# Patient Record
Sex: Male | Born: 1990 | Race: White | Hispanic: No | Marital: Married | Smoking: Never smoker
Health system: Southern US, Community
[De-identification: ages and names within clinical notes are randomized; demographics above are authoritative.]

---
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
## 2011-07-29 ENCOUNTER — Emergency Department (HOSPITAL_COMMUNITY): Payer: Self-pay

## 2011-07-29 ENCOUNTER — Emergency Department (HOSPITAL_COMMUNITY)
Admission: EM | Admit: 2011-07-29 | Discharge: 2011-07-29 | Disposition: A | Discharge status: Home / Self Care | Payer: No Typology Code available for payment source | Attending: Emergency Medicine | Admitting: Emergency Medicine

## 2011-07-29 DIAGNOSIS — M542 Cervicalgia: Secondary | ICD-10-CM | POA: Insufficient documentation

## 2012-04-02 ENCOUNTER — Ambulatory Visit (INDEPENDENT_AMBULATORY_CARE_PROVIDER_SITE_OTHER): Payer: 59 | Admitting: Medical

## 2012-04-02 DIAGNOSIS — Z113 Encounter for screening for infections with a predominantly sexual mode of transmission: Secondary | ICD-10-CM

## 2012-04-02 DIAGNOSIS — H9201 Otalgia, right ear: Secondary | ICD-10-CM

## 2012-04-02 DIAGNOSIS — H9209 Otalgia, unspecified ear: Secondary | ICD-10-CM

## 2012-04-02 DIAGNOSIS — Z139 Encounter for screening, unspecified: Secondary | ICD-10-CM

## 2012-04-02 NOTE — Patient Instructions (Signed)

## 2012-04-02 NOTE — Progress Notes (Signed)
  Subjective:   HPI  Seth Lynch is a 21 y.o. male who presents for multiple c/o.  His girlfriend was just diagnosed with shingles.  He notes that he doesn't think he has ever had chicken pox disease or vaccination.  Was to be screened for this given girlfriend shingles infection.  He has questions about her condition, spread of disease and his risks of catching this.    He wants STD screening.  Has had multiple partners prior, monogamous currently.  Doesn't use condoms always.  NO prior hx/o STD.  No symptoms currently.   Lately been having some right ear pain.  Was swimming in the ocean last week.  currently no pain, but wanted this checked out.   No other c/o.  The following portions of the patient's history were reviewed and updated as appropriate: allergies, current medications, past family history, past medical history, past social history, past surgical history and problem list.  Review of Systems ROS reviewed and was negative other than noted in HPI or above.    Objective:   Physical Exam  General appearance: alert, no distress, WD/WN HEENT: normocephalic, sclerae anicteric, TMs pearly, ear canals and pinnae normal, nares patent, no discharge or erythema, pharynx normal Oral cavity: MMM, no lesions Neck: supple, no lymphadenopathy, no thyromegaly, no masses GU: normal male genitalia, no mass or hernia, no rash  Assessment and Plan :     Encounter Diagnoses  Name Primary?  . Screen for STD (sexually transmitted disease) Yes  . Screening for condition   . Otalgia of right ear    STD screening today, discussed safe sex, prevention.  Screen for varicella titer.  If not immune will vaccinate.  Discussed shingles diagnosis, chicken pox disease process, the relation of the 2.    Otalgia - no obvious infection.  Watch and wait approach.  Not currently symptomatic today.

## 2012-04-03 LAB — HIV ANTIBODY (ROUTINE TESTING W REFLEX): HIV: NONREACTIVE

## 2012-04-03 LAB — RPR

## 2012-04-03 LAB — VARICELLA ZOSTER ANTIBODY, IGG: Varicella IgG: 0.86 {ISR}

## 2012-04-03 LAB — GC/CHLAMYDIA PROBE AMP, URINE
Chlamydia, Swab/Urine, PCR: NEGATIVE
GC Probe Amp, Urine: NEGATIVE

## 2012-06-03 ENCOUNTER — Ambulatory Visit (INDEPENDENT_AMBULATORY_CARE_PROVIDER_SITE_OTHER): Payer: 59 | Admitting: Medical

## 2012-06-03 ENCOUNTER — Encounter: Payer: Self-pay | Admitting: Medical

## 2012-06-03 VITALS — BP 112/80 | HR 60 | Temp 98.0°F | Resp 18 | Wt 205.0 lb

## 2012-06-03 DIAGNOSIS — R109 Unspecified abdominal pain: Secondary | ICD-10-CM

## 2012-06-03 DIAGNOSIS — R102 Pelvic and perineal pain: Secondary | ICD-10-CM

## 2012-06-03 DIAGNOSIS — R141 Gas pain: Secondary | ICD-10-CM

## 2012-06-03 DIAGNOSIS — R300 Dysuria: Secondary | ICD-10-CM

## 2012-06-03 DIAGNOSIS — R140 Abdominal distension (gaseous): Secondary | ICD-10-CM

## 2012-06-03 LAB — POCT URINALYSIS DIPSTICK
Bilirubin, UA: NEGATIVE
Blood, UA: NEGATIVE
Glucose, UA: NEGATIVE
Ketones, UA: NEGATIVE
Leukocytes, UA: NEGATIVE
Nitrite, UA: NEGATIVE
Protein, UA: NEGATIVE
Spec Grav, UA: <=1.005
Urobilinogen, UA: NEGATIVE
pH, UA: 7

## 2012-06-03 NOTE — Patient Instructions (Signed)
Start keeping a food and symptom diary to see if you can narrow down potential food triggers.  For the time being, avoid lots of onions, beans, cheese, fried foods, and heavy portions.  Consider beginning Miralax once daily OTC.    Increase water intake.  If you notice any new symptoms - diarrhea, constipation, blood in stool or urine, urinary changed, penile discharge, etc. , then let me know.    If not improving, the next steps are to check some labs and potentially a scan of the abdomen/pelvis.

## 2012-06-03 NOTE — Progress Notes (Signed)
Subjective: Here for c/o abdominal and pelvic pain.   Problem has been occuring for several months, intermittent.  Every few months gets gas pains,bloating and  lower stomach pain/pelvic pain.   He tries to burp or pass gas, but this doesn't help.  Comes on slowly, then is followed by body aches.  Last night he even felt feverish.  Usually will get significant pain for a few hours then is will subside.  Not worse with activity, movement, specific foods.  He denies abdominal trauma.  He does report eating relatively healthy but does eat a good bit of onions.  He denies blood in stool or urine, no urinary symptoms, no penile discharge, no nausea, vomiting, diarrhea or constipation.  No chest pain, SOB.  Nothing seems to help the pain.  No hx/o appendicitis,no family hx/o similar issue.  He has tried ibuprofen which at times can offer some relief briefly.  He did see me months ago for STD screen which was normal.  He has same partner, no new concerns for STD exposure.   No past medical history on file.  Review of Systems As noted above in HPI    Objective:   Physical Exam  Filed Vitals:   06/03/12 1518  BP: 112/80  Pulse: 60  Temp: 98 F (36.7 C)  Resp: 18    General appearance: alert, no distress, WD/WN Oral cavity: MMM, no lesions Neck: supple, no lymphadenopathy, no thyromegaly, no masses Heart: RRR, normal S1, S2, no murmurs Lungs: CTA bilaterally, no wheezes, rhonchi, or rales Abdomen: +bs, soft, mild suprapubic tenderness, otherwise non tender, non distended, no masses, no hepatomegaly, no splenomegaly Pulses: 2+ symmetric GU: normal male genitalia, no lesions, no tenderness, no mass, no hernia  Assessment and Plan :    Encounter Diagnoses  Name Primary?  . Abdominal pain Yes  . Male pelvic pain   . Bloating   . Dysuria    discussed differential which is quite long (gastrointestinal, genitourinary, infection, musculoskeletal, lymphadenitis or possibly even more worrisome  causes such as lymphoma) and his symptoms are vague, more suggestive of bloating/possible IBS.  Discussed keeping food diary and symptom diary.  Avoid onions for now and other foods that cause gas.  He will try the diary for now and begin OTC MIralax daily to help narrow down symptoms and possible triggers.  At any point in the next month or so, I offered that we could get baseline labs and possible abdominal imaging (CT abd/pelvis).  He declines today, but will check insurance copay and deductibles.  Call/reutrn if symptoms persist or worsen.

## 2012-06-04 ENCOUNTER — Encounter: Payer: Self-pay | Admitting: Medical

## 2012-08-01 ENCOUNTER — Encounter (HOSPITAL_COMMUNITY)
Admission: EM | Disposition: A | Discharge status: Home / Self Care | Payer: Self-pay | Source: Home / Self Care | Attending: Emergency Medicine

## 2012-08-01 ENCOUNTER — Ambulatory Visit (HOSPITAL_COMMUNITY)
Admission: EM | Admit: 2012-08-01 | Discharge: 2012-08-02 | DRG: 343 | Disposition: A | Discharge status: Home / Self Care | Payer: Commercial Managed Care - PPO | Attending: Surgery | Admitting: Surgery

## 2012-08-01 ENCOUNTER — Emergency Department (HOSPITAL_COMMUNITY): Payer: Commercial Managed Care - PPO

## 2012-08-01 ENCOUNTER — Encounter (HOSPITAL_COMMUNITY): Payer: Self-pay | Admitting: Certified Registered"

## 2012-08-01 ENCOUNTER — Emergency Department (HOSPITAL_COMMUNITY): Payer: Commercial Managed Care - PPO | Admitting: Certified Registered"

## 2012-08-01 ENCOUNTER — Encounter (HOSPITAL_COMMUNITY): Payer: Self-pay | Admitting: *Deleted

## 2012-08-01 DIAGNOSIS — K37 Unspecified appendicitis: Secondary | ICD-10-CM

## 2012-08-01 DIAGNOSIS — K3580 Unspecified acute appendicitis: Secondary | ICD-10-CM

## 2012-08-01 DIAGNOSIS — F17200 Nicotine dependence, unspecified, uncomplicated: Secondary | ICD-10-CM | POA: Insufficient documentation

## 2012-08-01 DIAGNOSIS — R1031 Right lower quadrant pain: Secondary | ICD-10-CM | POA: Insufficient documentation

## 2012-08-01 HISTORY — PX: LAPAROSCOPIC APPENDECTOMY: SHX408

## 2012-08-01 LAB — COMPREHENSIVE METABOLIC PANEL
ALT: 20 U/L (ref 0–53)
AST: 25 U/L (ref 0–37)
Albumin: 4.7 g/dL (ref 3.5–5.2)
Alkaline Phosphatase: 41 U/L (ref 39–117)
BUN: 19 mg/dL (ref 6–23)
CO2: 21 mEq/L (ref 19–32)
Calcium: 9.5 mg/dL (ref 8.4–10.5)
Chloride: 102 mEq/L (ref 96–112)
Creatinine, Ser: 1.02 mg/dL (ref 0.50–1.35)
GFR calc Af Amer: >90 mL/min (ref >90)
GFR calc non Af Amer: >90 mL/min (ref >90)
Glucose, Bld: 95 mg/dL (ref 70–99)
Potassium: 3.7 mEq/L (ref 3.5–5.1)
Sodium: 136 mEq/L (ref 135–145)
Total Bilirubin: 0.6 mg/dL (ref 0.3–1.2)
Total Protein: 7.3 g/dL (ref 6.0–8.3)

## 2012-08-01 LAB — CBC WITH DIFFERENTIAL/PLATELET
Basophils Absolute: 0 10*3/uL (ref 0.0–0.1)
Basophils Relative: 0 % (ref 0–1)
Eosinophils Absolute: 0.3 10*3/uL (ref 0.0–0.7)
Eosinophils Relative: 2 % (ref 0–5)
HCT: 44.7 % (ref 39.0–52.0)
Hemoglobin: 15.7 g/dL (ref 13.0–17.0)
Lymphocytes Relative: 7 % — ABNORMAL LOW (ref 12–46)
Lymphs Abs: 1.3 10*3/uL (ref 0.7–4.0)
MCH: 30.4 pg (ref 26.0–34.0)
MCHC: 35.1 g/dL (ref 30.0–36.0)
MCV: 86.5 fL (ref 78.0–100.0)
Monocytes Absolute: 1.5 10*3/uL — ABNORMAL HIGH (ref 0.1–1.0)
Monocytes Relative: 9 % (ref 3–12)
Neutro Abs: 14.6 10*3/uL — ABNORMAL HIGH (ref 1.7–7.7)
Neutrophils Relative %: 82 % — ABNORMAL HIGH (ref 43–77)
Platelets: 162 10*3/uL (ref 150–400)
RBC: 5.17 MIL/uL (ref 4.22–5.81)
RDW: 12.6 % (ref 11.5–15.5)
WBC: 17.8 10*3/uL — ABNORMAL HIGH (ref 4.0–10.5)

## 2012-08-01 LAB — URINALYSIS, ROUTINE W REFLEX MICROSCOPIC
Bilirubin Urine: NEGATIVE
Glucose, UA: NEGATIVE mg/dL
Hgb urine dipstick: NEGATIVE
Ketones, ur: NEGATIVE mg/dL
Leukocytes, UA: NEGATIVE
Nitrite: NEGATIVE
Protein, ur: NEGATIVE mg/dL
Specific Gravity, Urine: 1.017 (ref 1.005–1.030)
Urobilinogen, UA: 0.2 mg/dL (ref 0.0–1.0)
pH: 6 (ref 5.0–8.0)

## 2012-08-01 LAB — LIPASE, BLOOD: Lipase: 23 U/L (ref 11–59)

## 2012-08-01 SURGERY — APPENDECTOMY, LAPAROSCOPIC
Anesthesia: General | Site: Abdomen | Wound class: Contaminated

## 2012-08-01 MED ORDER — BUPIVACAINE-EPINEPHRINE 0.25% -1:200000 IJ SOLN
INTRAMUSCULAR | Status: DC | PRN
  Administered 2012-08-01: 6 mL

## 2012-08-01 MED ORDER — DEXAMETHASONE SODIUM PHOSPHATE 4 MG/ML IJ SOLN
INTRAMUSCULAR | Status: DC | PRN
  Administered 2012-08-01: 4 mg via INTRAVENOUS

## 2012-08-01 MED ORDER — MIDAZOLAM HCL 5 MG/5ML IJ SOLN
INTRAMUSCULAR | Status: DC | PRN
  Administered 2012-08-01: 2 mg via INTRAVENOUS

## 2012-08-01 MED ORDER — ONDANSETRON HCL 4 MG/2ML IJ SOLN
4.0000 mg | Freq: Once | INTRAMUSCULAR | Status: AC
  Administered 2012-08-01 (×2): 4 mg via INTRAVENOUS
  Filled 2012-08-01: qty 2

## 2012-08-01 MED ORDER — IOHEXOL 300 MG/ML  SOLN
20.0000 mL | INTRAMUSCULAR | Status: AC
  Administered 2012-08-01 (×2): 20 mL via ORAL

## 2012-08-01 MED ORDER — GLYCOPYRROLATE 0.2 MG/ML IJ SOLN
INTRAMUSCULAR | Status: DC | PRN
  Administered 2012-08-01: .4 mg via INTRAVENOUS

## 2012-08-01 MED ORDER — SUCCINYLCHOLINE CHLORIDE 20 MG/ML IJ SOLN
INTRAMUSCULAR | Status: DC | PRN
  Administered 2012-08-01: 100 mg via INTRAVENOUS

## 2012-08-01 MED ORDER — OXYCODONE HCL 5 MG PO TABS: 5.0000 mg | ORAL_TABLET | Freq: Once | ORAL | Status: DC | PRN

## 2012-08-01 MED ORDER — SODIUM CHLORIDE 0.9 % IR SOLN
Status: DC | PRN
  Administered 2012-08-01: 1000 mL

## 2012-08-01 MED ORDER — HYDROMORPHONE HCL PF 1 MG/ML IJ SOLN
1.0000 mg | INTRAMUSCULAR | Status: DC | PRN
  Administered 2012-08-01: 2 mg via INTRAVENOUS
  Administered 2012-08-01: 1 mg via INTRAVENOUS
  Administered 2012-08-01: 2 mg via INTRAVENOUS
  Administered 2012-08-02: 1 mg via INTRAVENOUS
  Filled 2012-08-01: qty 1
  Filled 2012-08-01 (×2): qty 2

## 2012-08-01 MED ORDER — NEOSTIGMINE METHYLSULFATE 1 MG/ML IJ SOLN
INTRAMUSCULAR | Status: DC | PRN
  Administered 2012-08-01: 3 mg via INTRAVENOUS

## 2012-08-01 MED ORDER — HYDROMORPHONE HCL PF 1 MG/ML IJ SOLN
0.2500 mg | INTRAMUSCULAR | Status: DC | PRN
  Administered 2012-08-01 (×6): 0.5 mg via INTRAVENOUS

## 2012-08-01 MED ORDER — HYDROMORPHONE HCL PF 1 MG/ML IJ SOLN
INTRAMUSCULAR | Status: AC
  Filled 2012-08-01: qty 1

## 2012-08-01 MED ORDER — MIDAZOLAM HCL 2 MG/2ML IJ SOLN: 1.0000 mg | INTRAMUSCULAR | Status: DC | PRN

## 2012-08-01 MED ORDER — OXYCODONE-ACETAMINOPHEN 5-325 MG PO TABS
1.0000 | ORAL_TABLET | ORAL | Status: DC | PRN
  Administered 2012-08-01 – 2012-08-02 (×3): 2 via ORAL
  Filled 2012-08-01 (×2): qty 2

## 2012-08-01 MED ORDER — FENTANYL CITRATE 0.05 MG/ML IJ SOLN: 50.0000 ug | Freq: Once | INTRAMUSCULAR | Status: DC

## 2012-08-01 MED ORDER — OXYCODONE-ACETAMINOPHEN 5-325 MG PO TABS
ORAL_TABLET | ORAL | Status: AC
  Filled 2012-08-01: qty 2

## 2012-08-01 MED ORDER — ONDANSETRON HCL 4 MG PO TABS: 4.0000 mg | ORAL_TABLET | Freq: Four times a day (QID) | ORAL | Status: DC | PRN

## 2012-08-01 MED ORDER — ONDANSETRON HCL 4 MG/2ML IJ SOLN: 4.0000 mg | Freq: Four times a day (QID) | INTRAMUSCULAR | Status: DC | PRN

## 2012-08-01 MED ORDER — LIDOCAINE HCL (CARDIAC) 20 MG/ML IV SOLN
INTRAVENOUS | Status: DC | PRN
  Administered 2012-08-01: 100 mg via INTRAVENOUS

## 2012-08-01 MED ORDER — KCL IN DEXTROSE-NACL 20-5-0.45 MEQ/L-%-% IV SOLN
INTRAVENOUS | Status: DC
  Administered 2012-08-01 – 2012-08-02 (×2): via INTRAVENOUS
  Filled 2012-08-01 (×5): qty 1000

## 2012-08-01 MED ORDER — 0.9 % SODIUM CHLORIDE (POUR BTL) OPTIME
TOPICAL | Status: DC | PRN
  Administered 2012-08-01: 1000 mL

## 2012-08-01 MED ORDER — ENOXAPARIN SODIUM 40 MG/0.4ML ~~LOC~~ SOLN
40.0000 mg | SUBCUTANEOUS | Status: DC
  Filled 2012-08-01: qty 0.4

## 2012-08-01 MED ORDER — FENTANYL CITRATE 0.05 MG/ML IJ SOLN
INTRAMUSCULAR | Status: DC | PRN
  Administered 2012-08-01: 150 ug via INTRAVENOUS

## 2012-08-01 MED ORDER — IOHEXOL 300 MG/ML  SOLN
100.0000 mL | Freq: Once | INTRAMUSCULAR | Status: AC | PRN
  Administered 2012-08-01: 100 mL via INTRAVENOUS

## 2012-08-01 MED ORDER — SODIUM CHLORIDE 0.9 % IV BOLUS (SEPSIS)
1000.0000 mL | Freq: Once | INTRAVENOUS | Status: AC
  Administered 2012-08-01: 1000 mL via INTRAVENOUS

## 2012-08-01 MED ORDER — HYDROMORPHONE HCL PF 1 MG/ML IJ SOLN: 1.0000 mg | INTRAMUSCULAR | Status: DC | PRN

## 2012-08-01 MED ORDER — SODIUM CHLORIDE 0.9 % IV SOLN
3.0000 g | Freq: Once | INTRAVENOUS | Status: AC
  Administered 2012-08-01: 3 g via INTRAVENOUS
  Filled 2012-08-01: qty 3

## 2012-08-01 MED ORDER — SODIUM CHLORIDE 0.9 % IV SOLN
INTRAVENOUS | Status: DC
  Administered 2012-08-01 (×2): via INTRAVENOUS

## 2012-08-01 MED ORDER — OXYCODONE HCL 5 MG/5ML PO SOLN: 5.0000 mg | Freq: Once | ORAL | Status: DC | PRN

## 2012-08-01 MED ORDER — INFLUENZA VIRUS VACC SPLIT PF IM SUSP
0.5000 mL | INTRAMUSCULAR | Status: DC
  Filled 2012-08-01: qty 0.5

## 2012-08-01 MED ORDER — ROCURONIUM BROMIDE 100 MG/10ML IV SOLN
INTRAVENOUS | Status: DC | PRN
  Administered 2012-08-01: 30 mg via INTRAVENOUS

## 2012-08-01 MED ORDER — PROMETHAZINE HCL 25 MG/ML IJ SOLN: 6.2500 mg | INTRAMUSCULAR | Status: DC | PRN

## 2012-08-01 SURGICAL SUPPLY — 46 items
ADH SKN CLS APL DERMABOND .7 (GAUZE/BANDAGES/DRESSINGS) ×1
APL SKNCLS STERI-STRIP NONHPOA (GAUZE/BANDAGES/DRESSINGS) ×1
APPLIER CLIP ROT 10 11.4 M/L (STAPLE)
APR CLP MED LRG 11.4X10 (STAPLE)
BAG SPEC RTRVL LRG 6X4 10 (ENDOMECHANICALS) ×1
BENZOIN TINCTURE PRP APPL 2/3 (GAUZE/BANDAGES/DRESSINGS) ×1 IMPLANT
BLADE SURG ROTATE 9660 (MISCELLANEOUS) ×1 IMPLANT
CANISTER SUCTION 2500CC (MISCELLANEOUS) ×2 IMPLANT
CHLORAPREP W/TINT 26ML (MISCELLANEOUS) ×2 IMPLANT
CLIP APPLIE ROT 10 11.4 M/L (STAPLE) IMPLANT
CLOTH BEACON ORANGE TIMEOUT ST (SAFETY) ×2 IMPLANT
COVER SURGICAL LIGHT HANDLE (MISCELLANEOUS) ×2 IMPLANT
CUTTER LINEAR ENDO 35 ETS (STAPLE) ×1 IMPLANT
CUTTER LINEAR ENDO 35 ETS TH (STAPLE) IMPLANT
DECANTER SPIKE VIAL GLASS SM (MISCELLANEOUS) ×2 IMPLANT
DERMABOND ADVANCED (GAUZE/BANDAGES/DRESSINGS) ×1
DERMABOND ADVANCED .7 DNX12 (GAUZE/BANDAGES/DRESSINGS) ×1 IMPLANT
DRAPE UTILITY 15X26 W/TAPE STR (DRAPE) ×4 IMPLANT
ELECT REM PT RETURN 9FT ADLT (ELECTROSURGICAL) ×2
ELECTRODE REM PT RTRN 9FT ADLT (ELECTROSURGICAL) ×1 IMPLANT
ENDOLOOP SUT PDS II  0 18 (SUTURE)
ENDOLOOP SUT PDS II 0 18 (SUTURE) IMPLANT
GLOVE BIOGEL PI IND STRL 8 (GLOVE) ×1 IMPLANT
GLOVE BIOGEL PI INDICATOR 8 (GLOVE) ×1
GLOVE ECLIPSE 7.5 STRL STRAW (GLOVE) ×2 IMPLANT
GOWN STRL NON-REIN LRG LVL3 (GOWN DISPOSABLE) ×4 IMPLANT
KIT BASIN OR (CUSTOM PROCEDURE TRAY) ×2 IMPLANT
KIT ROOM TURNOVER OR (KITS) ×2 IMPLANT
NS IRRIG 1000ML POUR BTL (IV SOLUTION) ×2 IMPLANT
PAD ARMBOARD 7.5X6 YLW CONV (MISCELLANEOUS) ×4 IMPLANT
PENCIL BUTTON HOLSTER BLD 10FT (ELECTRODE) IMPLANT
POUCH SPECIMEN RETRIEVAL 10MM (ENDOMECHANICALS) ×2 IMPLANT
RELOAD /EVU35 (ENDOMECHANICALS) ×1 IMPLANT
RELOAD CUTTER ETS 35MM STAND (ENDOMECHANICALS) IMPLANT
SET IRRIG TUBING LAPAROSCOPIC (IRRIGATION / IRRIGATOR) ×2 IMPLANT
SPECIMEN JAR SMALL (MISCELLANEOUS) ×2 IMPLANT
STRIP CLOSURE SKIN 1/2X4 (GAUZE/BANDAGES/DRESSINGS) ×1 IMPLANT
SUT MNCRL AB 4-0 PS2 18 (SUTURE) ×2 IMPLANT
TOWEL OR 17X24 6PK STRL BLUE (TOWEL DISPOSABLE) ×2 IMPLANT
TOWEL OR 17X26 10 PK STRL BLUE (TOWEL DISPOSABLE) ×2 IMPLANT
TRAY FOLEY CATH 14FR (SET/KITS/TRAYS/PACK) ×2 IMPLANT
TRAY LAPAROSCOPIC (CUSTOM PROCEDURE TRAY) ×2 IMPLANT
TROCAR XCEL 12X100 BLDLESS (ENDOMECHANICALS) ×2 IMPLANT
TROCAR XCEL BLUNT TIP 100MML (ENDOMECHANICALS) ×2 IMPLANT
TROCAR XCEL NON-BLD 5MMX100MML (ENDOMECHANICALS) ×2 IMPLANT
WATER STERILE IRR 1000ML POUR (IV SOLUTION) IMPLANT

## 2012-08-01 NOTE — Anesthesia Postprocedure Evaluation (Signed)
  Anesthesia Post-op Note  Patient: Seth Lynch  Procedure(s) Performed: Procedure(s) (LRB) with comments: APPENDECTOMY LAPAROSCOPIC (N/A)  Patient Location: PACU  Anesthesia Type:General  Level of Consciousness: awake  Airway and Oxygen Therapy: Patient Spontanous Breathing  Post-op Pain: mild  Post-op Assessment: Post-op Vital signs reviewed  Post-op Vital Signs: Reviewed  Complications: No apparent anesthesia complications

## 2012-08-01 NOTE — H&P (Signed)
Seth Lynch is an 21 y.o. male.   Chief Complaint: Abdominal pain and acute appendicitis HPI: Patient has had abdominal pain in a recurrent pattern over the past year.  Saw his PCP about a month ago and this was thought to be gastroenteritis, but the pain is essentially the same.  Worse this time but in the same area.  CT demonstrates acute appendicitis with WBC of 17K.  History reviewed. No pertinent past medical history.  History reviewed. No pertinent past surgical history.  No family history on file. Social History:  reports that he has been smoking Cigarettes.  He does not have any smokeless tobacco history on file. He reports that he drinks about 9 ounces of alcohol per week. He reports that he uses illicit drugs (Marijuana) about 3 times per week.  Allergies: No Known Allergies   (Not in a hospital admission)  Results for orders placed during the hospital encounter of 08/01/12 (from the past 48 hour(s))  CBC WITH DIFFERENTIAL     Status: Abnormal   Collection Time   08/01/12 12:56 AM      Component Value Range Comment   WBC 17.8 (*) 4.0 - 10.5 K/uL    RBC 5.17  4.22 - 5.81 MIL/uL    Hemoglobin 15.7  13.0 - 17.0 g/dL    HCT 14.7  82.9 - 56.2 %    MCV 86.5  78.0 - 100.0 fL    MCH 30.4  26.0 - 34.0 pg    MCHC 35.1  30.0 - 36.0 g/dL    RDW 13.0  86.5 - 78.4 %    Platelets 162  150 - 400 K/uL    Neutrophils Relative 82 (*) 43 - 77 %    Neutro Abs 14.6 (*) 1.7 - 7.7 K/uL    Lymphocytes Relative 7 (*) 12 - 46 %    Lymphs Abs 1.3  0.7 - 4.0 K/uL    Monocytes Relative 9  3 - 12 %    Monocytes Absolute 1.5 (*) 0.1 - 1.0 K/uL    Eosinophils Relative 2  0 - 5 %    Eosinophils Absolute 0.3  0.0 - 0.7 K/uL    Basophils Relative 0  0 - 1 %    Basophils Absolute 0.0  0.0 - 0.1 K/uL   COMPREHENSIVE METABOLIC PANEL     Status: Normal   Collection Time   08/01/12 12:56 AM      Component Value Range Comment   Sodium 136  135 - 145 mEq/L    Potassium 3.7  3.5 - 5.1 mEq/L    Chloride 102  96 - 112 mEq/L    CO2 21  19 - 32 mEq/L    Glucose, Bld 95  70 - 99 mg/dL    BUN 19  6 - 23 mg/dL    Creatinine, Ser 6.96  0.50 - 1.35 mg/dL    Calcium 9.5  8.4 - 29.5 mg/dL    Total Protein 7.3  6.0 - 8.3 g/dL    Albumin 4.7  3.5 - 5.2 g/dL    AST 25  0 - 37 U/L    ALT 20  0 - 53 U/L    Alkaline Phosphatase 41  39 - 117 U/L    Total Bilirubin 0.6  0.3 - 1.2 mg/dL    GFR calc non Af Amer >90  >90 mL/min    GFR calc Af Amer >90  >90 mL/min   LIPASE, BLOOD     Status: Normal  Collection Time   08/01/12 12:56 AM      Component Value Range Comment   Lipase 23  11 - 59 U/L   URINALYSIS, ROUTINE W REFLEX MICROSCOPIC     Status: Normal   Collection Time   08/01/12  1:11 AM      Component Value Range Comment   Color, Urine YELLOW  YELLOW    APPearance CLEAR  CLEAR    Specific Gravity, Urine 1.017  1.005 - 1.030    pH 6.0  5.0 - 8.0    Glucose, UA NEGATIVE  NEGATIVE mg/dL    Hgb urine dipstick NEGATIVE  NEGATIVE    Bilirubin Urine NEGATIVE  NEGATIVE    Ketones, ur NEGATIVE  NEGATIVE mg/dL    Protein, ur NEGATIVE  NEGATIVE mg/dL    Urobilinogen, UA 0.2  0.0 - 1.0 mg/dL    Nitrite NEGATIVE  NEGATIVE    Leukocytes, UA NEGATIVE  NEGATIVE MICROSCOPIC NOT DONE ON URINES WITH NEGATIVE PROTEIN, BLOOD, LEUKOCYTES, NITRITE, OR GLUCOSE <1000 mg/dL.   Ct Abdomen Pelvis W Contrast  08/01/2012  *RADIOLOGY REPORT*  Clinical Data: Right lower quadrant abdominal pain.  CT ABDOMEN AND PELVIS WITH CONTRAST  Technique:  Multidetector CT imaging of the abdomen and pelvis was performed following the standard protocol during bolus administration of intravenous contrast.  Contrast: OMNIPAQUE IOHEXOL 300 MG/ML  SOLN  Comparison: None.  Findings: The visualized lung bases are clear.  The liver and spleen are unremarkable in appearance.  The gallbladder is within normal limits.  The pancreas and adrenal glands are unremarkable.  The kidneys are unremarkable in appearance.  There is no evidence of  hydronephrosis.  No renal or ureteral stones are seen.  No perinephric stranding is appreciated.  No free fluid is identified.  The small bowel is unremarkable in appearance.  The stomach is within normal limits.  No acute vascular abnormalities are seen.  The appendix is dilated to 1.1 cm in maximal diameter, with associated wall enhancement and thickening, and mild associated soft tissue stranding.  This is compatible with mild acute appendicitis.  The appendix is predominately inferior to the cecum.  No significant pericecal lymphadenopathy is seen.  There is no evidence of perforation or abscess formation.  The colon is unremarkable in appearance.  The bladder is mildly distended and grossly unremarkable in appearance.  The prostate is normal in size.  No inguinal lymphadenopathy is seen.  No acute osseous abnormalities are identified.  IMPRESSION: Mild acute appendicitis noted, with dilatation of the appendix to 1.1 cm in maximal diameter, and associated wall enhancement and thickening.  Mild soft tissue stranding noted.  No evidence for perforation or abscess formation.   Original Report Authenticated By: Tonia Ghent, M.D.     Review of Systems  Constitutional: Negative for fever, chills and weight loss.  HENT: Negative.   Eyes: Negative.   Respiratory: Negative.   Cardiovascular: Negative.   Gastrointestinal: Positive for abdominal pain.  Genitourinary: Negative.   Musculoskeletal: Negative.   Skin: Negative.   Neurological: Negative.   Endo/Heme/Allergies: Negative.   Psychiatric/Behavioral: Negative.     Blood pressure 114/61, pulse 66, temperature 97.9 F (36.6 C), temperature source Oral, resp. rate 18, height 6' (1.829 m), weight 97.523 kg (215 lb), SpO2 98.00%. Physical Exam  Constitutional: He is oriented to person, place, and time. He appears well-developed and well-nourished.  HENT:  Head: Normocephalic and atraumatic.  Eyes: Conjunctivae normal and EOM are normal. Pupils  are equal, round, and reactive to  light.  Neck: Normal range of motion. Neck supple.  Cardiovascular: Normal rate, regular rhythm and normal heart sounds.   Respiratory: Effort normal and breath sounds normal.  GI: Soft. Bowel sounds are decreased. There is tenderness (positive Rovsing sign) in the right lower quadrant and suprapubic area. There is tenderness at McBurney's point. There is no rebound and no CVA tenderness.  Genitourinary: Penis normal.  Musculoskeletal: Normal range of motion.  Neurological: He is alert and oriented to person, place, and time. He has normal reflexes.  Skin: Skin is warm and dry.  Psychiatric: He has a normal mood and affect. His behavior is normal. Judgment and thought content normal.     Assessment/Plan This recurrent pattern of abdominal pain is inconsistent with acute appendicitis, but CT findings and elevated WBC strongly suggest that diagnosis.   Will take the patient to the OR for diagnostic laparoscopy with appendectomy.  Cherylynn Ridges 08/01/2012, 5:50 AM

## 2012-08-01 NOTE — ED Notes (Signed)
Patient transported to CT 

## 2012-08-01 NOTE — ED Notes (Signed)
Unasyn will be sent with pt on call to OR

## 2012-08-01 NOTE — Progress Notes (Signed)
Report from Devota Pace, RN (PACU)

## 2012-08-01 NOTE — ED Provider Notes (Signed)
History     CSN: [id number]  Arrival date & time 08/01/12  0045   First MD Initiated Contact with Patient 08/01/12 0149      Chief Complaint  Patient presents with  . Abdominal Pain    (Consider location/radiation/quality/duration/timing/severity/associated sxs/prior treatment) HPI.... right lower quadrant pain intermittently for one year. Symptoms returned in the past 24 hours. Poor appetite. No radiation of pain. No fever, chills, dysuria, chest pain, shortness of breath. Severity is mild to moderate.  Patient is normally healthy.  History reviewed. No pertinent past medical history.  History reviewed. No pertinent past surgical history.  No family history on file.  History  Substance Use Topics  . Smoking status: Current Some Day Smoker    Types: Cigarettes  . Smokeless tobacco: Not on file  . Alcohol Use: 9.0 oz/week    15 Cans of beer per week      Review of Systems  All other systems reviewed and are negative.    Allergies  Review of patient's allergies indicates no known allergies.  Home Medications   Current Outpatient Rx  Name  Route  Sig  Dispense  Refill  . IBUPROFEN 200 MG PO TABS   Oral   Take 200-1,000 mg by mouth every 6 (six) hours as needed. pain           BP 114/61  Pulse 66  Temp 97.9 F (36.6 C) (Oral)  Resp 18  Ht 6' (1.829 m)  Wt 215 lb (97.523 kg)  BMI 29.16 kg/m2  SpO2 98%  Physical Exam  Nursing note and vitals reviewed. Constitutional: He is oriented to person, place, and time. He appears well-developed and well-nourished.  HENT:  Head: Normocephalic and atraumatic.  Eyes: Conjunctivae normal and EOM are normal. Pupils are equal, round, and reactive to light.  Neck: Normal range of motion. Neck supple.  Cardiovascular: Normal rate, regular rhythm and normal heart sounds.   Pulmonary/Chest: Effort normal and breath sounds normal.  Abdominal: Soft. Bowel sounds are normal.       Tender right lower quadrant over  McBurney's point  Musculoskeletal: Normal range of motion.  Neurological: He is alert and oriented to person, place, and time.  Skin: Skin is warm and dry.  Psychiatric: He has a normal mood and affect.    ED Course  Procedures (including critical care time)  Labs Reviewed  CBC WITH DIFFERENTIAL - Abnormal; Notable for the following:    WBC 17.8 (*)     Neutrophils Relative 82 (*)     Neutro Abs 14.6 (*)     Lymphocytes Relative 7 (*)     Monocytes Absolute 1.5 (*)     All other components within normal limits  COMPREHENSIVE METABOLIC PANEL  LIPASE, BLOOD  URINALYSIS, ROUTINE W REFLEX MICROSCOPIC   Ct Abdomen Pelvis W Contrast  08/01/2012  *RADIOLOGY REPORT*  Clinical Data: Right lower quadrant abdominal pain.  CT ABDOMEN AND PELVIS WITH CONTRAST  Technique:  Multidetector CT imaging of the abdomen and pelvis was performed following the standard protocol during bolus administration of intravenous contrast.  Contrast: OMNIPAQUE IOHEXOL 300 MG/ML  SOLN  Comparison: None.  Findings: The visualized lung bases are clear.  The liver and spleen are unremarkable in appearance.  The gallbladder is within normal limits.  The pancreas and adrenal glands are unremarkable.  The kidneys are unremarkable in appearance.  There is no evidence of hydronephrosis.  No renal or ureteral stones are seen.  No perinephric stranding is appreciated.  No free fluid is identified.  The small bowel is unremarkable in appearance.  The stomach is within normal limits.  No acute vascular abnormalities are seen.  The appendix is dilated to 1.1 cm in maximal diameter, with associated wall enhancement and thickening, and mild associated soft tissue stranding.  This is compatible with mild acute appendicitis.  The appendix is predominately inferior to the cecum.  No significant pericecal lymphadenopathy is seen.  There is no evidence of perforation or abscess formation.  The colon is unremarkable in appearance.  The  bladder is mildly distended and grossly unremarkable in appearance.  The prostate is normal in size.  No inguinal lymphadenopathy is seen.  No acute osseous abnormalities are identified.  IMPRESSION: Mild acute appendicitis noted, with dilatation of the appendix to 1.1 cm in maximal diameter, and associated wall enhancement and thickening.  Mild soft tissue stranding noted.  No evidence for perforation or abscess formation.   Original Report Authenticated By: Tonia Ghent, M.D.      No diagnosis found.    MDM  Patient is nontoxic. However, he is tender around McBurney's point. White count 17,000.  CT suggestive of mild acute appendicitis.  Consult general surgery        Donnetta Hutching, MD 08/01/12 [phone]

## 2012-08-01 NOTE — ED Notes (Signed)
Pt returned to room  

## 2012-08-01 NOTE — Transfer of Care (Signed)
Immediate Anesthesia Transfer of Care Note  Patient: Seth Lynch  Procedure(s) Performed: Procedure(s) (LRB) with comments: APPENDECTOMY LAPAROSCOPIC (N/A)  Patient Location: PACU  Anesthesia Type:General  Level of Consciousness: awake, alert  and oriented  Airway & Oxygen Therapy: Patient Spontanous Breathing  Post-op Assessment: Report given to PACU RN, Post -op Vital signs reviewed and stable and Patient moving all extremities X 4  Post vital signs: Reviewed and stable  Complications: No apparent anesthesia complications

## 2012-08-01 NOTE — ED Notes (Signed)
Pt to ED c/o RLQ abd pain.  Pt has been experiencing this pain for at least a year and has been seen by pcp with no dx.  Pt states pain usually comes on once a month, last for one day, is associated with emesis and is mildly alleviated by ibuprofen.  Denies changes in bowel and bladder habits before, after or during episodes.  Pt took 1000 mg of ibuprofen at 2030 and 600 mg of ibuprofen at 12 am with minimal relief.

## 2012-08-01 NOTE — Progress Notes (Signed)
Pt is on hold for med/surg bed assignment. Family is aware.  Mom and friend in PACU for a brief visit. They are going home...will call them when patient is assigned to a room.

## 2012-08-01 NOTE — Anesthesia Preprocedure Evaluation (Addendum)
Anesthesia Evaluation  Patient identified by MRN, date of birth, ID band Patient awake    Reviewed: Allergy & Precautions, H&P , NPO status , Patient's Chart, lab work & pertinent test results, reviewed documented beta blocker date and time   Airway Mallampati: I TM Distance: >3 FB Neck ROM: full    Dental  (+) Teeth Intact   Pulmonary neg pulmonary ROS, Current Smoker,  breath sounds clear to auscultation        Cardiovascular Exercise Tolerance: Good negative cardio ROS  Rhythm:Regular Rate:Normal     Neuro/Psych negative neurological ROS     GI/Hepatic negative GI ROS, Neg liver ROS, (+)     substance abuse  marijuana use, Acuta apendicitis   Endo/Other  negative endocrine ROS  Renal/GU negative Renal ROS  negative genitourinary   Musculoskeletal   Abdominal (+)  Abdomen: tender.    Peds  Hematology negative hematology ROS (+)   Anesthesia Other Findings   Reproductive/Obstetrics                          Anesthesia Physical Anesthesia Plan  ASA: II and emergent  Anesthesia Plan: General ETT and General   Post-op Pain Management:    Induction: Intravenous, Rapid sequence and Cricoid pressure planned  Airway Management Planned: Oral ETT  Additional Equipment:   Intra-op Plan:   Post-operative Plan: Extubation in OR  Informed Consent: I have reviewed the patients History and Physical, chart, labs and discussed the procedure including the risks, benefits and alternatives for the proposed anesthesia with the patient or authorized representative who has indicated his/her understanding and acceptance.   Dental Advisory Given  Plan Discussed with: CRNA and Surgeon  Anesthesia Plan Comments:        Anesthesia Quick Evaluation

## 2012-08-01 NOTE — Op Note (Signed)
OPERATIVE REPORT  DATE OF OPERATION: 08/01/2012  PATIENT:  Seth Lynch  21 y.o. male  PRE-OPERATIVE DIAGNOSIS:  Appendicitis, acute [540.9]  POST-OPERATIVE DIAGNOSIS:  Appendicitis, acute [540.9]  PROCEDURE:  Procedure(s): APPENDECTOMY LAPAROSCOPIC  SURGEON:  Surgeon(s): Cherylynn Ridges, MD  ASSISTANT: None  ANESTHESIA:   general  EBL: <20 ml  BLOOD ADMINISTERED: none  DRAINS: none   SPECIMEN:  Source of Specimen:  Appendix  COUNTS CORRECT:  YES  PROCEDURE DETAILS: The patient was taken to the operating room and placed on the table in supine position. After an adequate general endotracheal anesthetic was administered he was prepped and draped in usual sterile manner exposing his entire abdomen.  After proper time out was performed identifying the patient and procedure to be performed a supraumbilical midline incision was made using #15 blade and taken down to the midline fascia. The fascia was nicked with a 15 blade then subsequently the edges were grabbed with Kocher clamps. As we tented up with the Kocher clamps we bluntly dissected down into the peritoneal cavity using a Kelly clamp. Once this was done a pursestring suture of 0 Vicryl was passed around the fascial opening. We then inserted a Hassan cannula into the peritoneal cavity through which carbon dioxide gas was insufflated up to maximal intra-abdominal pressure of 15 mm mercury.  Right upper quadrant 5 mm cannula and the left lower quadrant 12 mm cannula passed under direct vision. Once all cannulas were in place the patient was placed in Trendelenburg in the left side was tilted down.  The acutely inflamed appendix was easily noted in the right lower quadrant. We dissected the base of the appendix from the base of the cecum and came across the base with a Endo GIA 3.5 mm blue cartridge. We then dissected out the mesoappendix and came across that with a 2.5 mm a white Endo GIA cartridge. Once this was done those  minimal bleeding and we retrieved the appendix from the left lower quadrant cannula site with an Endo Catch bag.  Once this was completed we inspected the right lower quadrant site with those minimal bleeding. We irrigated with a small amount of saline. We aspirated all fluid and gas from above the liver the removed all cannulas.  The supraumbilical fascial site was closed using a Pershing suture which was in place. Once that was done we injected all sites were quarter percent Marcaine with epinephrine. All counts were correct. The skin was closed at the left lower quadrant and the subumbilical site using running subcuticular stitch of 4-0 Monocryl. Dermabond surges and Tegaderms use complete all dressings.  PATIENT DISPOSITION:  PACU - hemodynamically stable.   Sharayah Renfrow O 11/7/20137:59 AM

## 2012-08-02 ENCOUNTER — Encounter (HOSPITAL_COMMUNITY): Payer: Self-pay | Admitting: General Surgery

## 2012-08-02 MED ORDER — OXYCODONE-ACETAMINOPHEN 5-325 MG PO TABS: 1.0000 | ORAL_TABLET | ORAL | Status: DC | PRN

## 2012-08-02 NOTE — Discharge Summary (Signed)
Improving well.  Follow up in clinic in a few weeks.  If not improving or worse, call us sooner

## 2012-08-02 NOTE — Progress Notes (Signed)
DC HOME WITH GIRLFRIEND. VERBALLY UNDERSTOOD DC INSTRUCTIONS. NO QUESTIONS ASK.

## 2012-08-02 NOTE — Discharge Summary (Signed)
Patient ID: BAER SNAY MRN: [id number] DOB/AGE: 11/16/1990 21 y.o.  Admit date: 08/01/2012 Discharge date: 08/02/2012  Procedures: laparoscopic appendectomy  Consults: None  Reason for Admission: Patient has had abdominal pain in a recurrent pattern over the past year. Saw his PCP about a month ago and this was thought to be gastroenteritis, but the pain is essentially the same. Worse this time but in the same area. CT demonstrates acute appendicitis with WBC of 17K.  Admission Diagnoses:  1. Acute appendicitis  Hospital Course: The patient was admitted and taken to the operating room where he underwent a lap appy.  The patient tolerated the procedure well.  On POD# 1, his pain was well controlled and he was tolerating a regular diet.  He was felt stable for dc home.  PE: Abd: soft, appropriately tender, +BS, incisions c/d/i with some slight serous drainage noted  Discharge Diagnoses:  Active Problems:  Acute appendicitis s/p lap appy  Discharge Medications:   Medication List     As of 08/02/2012  8:55 AM    TAKE these medications         ibuprofen 200 MG tablet   Commonly known as: ADVIL,MOTRIN   Take 200-1,000 mg by mouth every 6 (six) hours as needed. pain      oxyCODONE-acetaminophen 5-325 MG per tablet   Commonly known as: PERCOCET/ROXICET   Take 1-2 tablets by mouth every 4 (four) hours as needed.        Discharge Instructions:     Follow-up Information    Follow up with [facility]. On 08/20/2012. (3:00pm. arrive at 2:30pm)    Contact information:   [address]   [place] [address] [phone]          Signed: Letha Cape 08/02/2012, 8:55 AM

## 2012-08-14 ENCOUNTER — Telehealth: Payer: Self-pay | Admitting: Family Medicine

## 2012-08-14 NOTE — Telephone Encounter (Signed)
Patient called for refill on Percocet, after reviewing his chart , we have not seen the patient recently or prescribed percocet. Per Dr. Susann Givens advised him that we can not refill Rx. Patient states that he had appendectomy done couple of weeks ago and was given that at the hospital. He states that he is still having pain, advised him to contact the surgeon

## 2012-08-14 NOTE — Telephone Encounter (Signed)
Wants refill on generic percocet    [facility] [address] [employer]

## 2012-08-20 ENCOUNTER — Encounter (INDEPENDENT_AMBULATORY_CARE_PROVIDER_SITE_OTHER): Payer: 59

## 2012-09-12 ENCOUNTER — Ambulatory Visit (INDEPENDENT_AMBULATORY_CARE_PROVIDER_SITE_OTHER): Payer: Commercial Managed Care - PPO | Admitting: Family Medicine

## 2012-09-12 ENCOUNTER — Encounter: Payer: Self-pay | Admitting: Family Medicine

## 2012-09-12 VITALS — BP 118/80 | HR 88 | Temp 98.7°F | Wt 216.0 lb

## 2012-09-12 DIAGNOSIS — B029 Zoster without complications: Secondary | ICD-10-CM

## 2012-09-12 MED ORDER — HYDROCODONE-ACETAMINOPHEN 7.5-750 MG PO TABS: 1.0000 | ORAL_TABLET | ORAL | Status: DC | PRN

## 2012-09-12 MED ORDER — VALACYCLOVIR HCL 1 G PO TABS: 1000.0000 mg | ORAL_TABLET | Freq: Three times a day (TID) | ORAL | Status: AC

## 2012-09-12 NOTE — Patient Instructions (Addendum)
Shingles Shingles is caused by the same virus that causes chickenpox (varicella zoster virus or VZV). Shingles often occurs many years or decades after having chickenpox. That is why it is more common in adults older than 50 years. The virus reactivates and breaks out as an infection in a nerve root. SYMPTOMS   The initial feeling (sensations) may be pain. This pain is usually described as:  Burning.  Stabbing.  Throbbing.  Tingling in the nerve root.  A red rash will follow in a couple days. The rash may occur in any area of the body and is usually on one side (unilateral) of the body in a band or belt-like pattern. The rash usually starts out as very small blisters (vesicles). They will dry up after 7 to 10 days. This is not usually a significant problem except for the pain it causes.  Long-lasting (chronic) pain is more likely in an elderly person. It can last months to years. This condition is called postherpetic neuralgia. Shingles can be an extremely severe infection in someone with AIDS, a weakened immune system, or with forms of leukemia. It can also be severe if you are taking transplant medicines or other medicines that weaken the immune system. TREATMENT  Your caregiver will often treat you with:  Antiviral drugs.  Anti-inflammatory drugs.  Pain medicines. Bed rest is very important in preventing the pain associated with herpes zoster (postherpetic neuralgia). Application of heat in the form of a hot water bottle or electric heating pad or gentle pressure with the hand is recommended to help with the pain or discomfort. PREVENTION  A varicella zoster vaccine is available to help protect against the virus. The Food and Drug Administration approved the varicella zoster vaccine for individuals 6 years of age and older. HOME CARE INSTRUCTIONS   Cool compresses to the area of rash may be helpful.  Only take over-the-counter or prescription medicines for pain, discomfort, or  fever as directed by your caregiver.  Avoid contact with:  Babies.  Pregnant women.  Children with eczema.  Elderly people with transplants.  People with chronic illnesses, such as leukemia and AIDS.  If the area involved is on your face, you may receive a referral for follow-up to a specialist. It is very important to keep all follow-up appointments. This will help avoid eye complications, chronic pain, or disability. SEEK IMMEDIATE MEDICAL CARE IF:   You develop any pain (headache) in the area of the face or eye. This must be followed carefully by your caregiver or ophthalmologist. An infection in part of your eye (cornea) can be very serious. It could lead to blindness.  You do not have pain relief from prescribed medicines.  Your redness or swelling spreads.  The area involved becomes very swollen and painful.  You have a fever.  You notice any red or painful lines extending away from the affected area toward your heart (lymphangitis).  Your condition is worsening or has changed. Document Released: 09/11/2005 Document Revised: 12/04/2011 Document Reviewed: 08/16/2009 [facility] Patient Information 2013 [place], [place]. If you have any concerns about infection I will see you again Advil or Aleve to be taken as well as the Vicodin. Advil to be taken 4 pills 3 times a day or Aleve 2 pills 3 times per

## 2012-09-12 NOTE — Progress Notes (Signed)
Called hydrocodone in per jcl 

## 2012-09-12 NOTE — Progress Notes (Signed)
  Subjective:    Patient ID: Seth Lynch, male    DOB: 03-02-1991, 21 y.o.   MRN: [id number]  HPI He complains of a one-day history of left flank and abdominal discomfort. He noted a rash today.   Review of Systems     Objective:   Physical Exam Erythematous vesicular lesions are noted in the T9 distribution on the left.       Assessment & Plan:   1. Shingles  valACYclovir (VALTREX) 1000 MG tablet, HYDROcodone-acetaminophen (VICODIN ES) 7.5-750 MG per tablet   information concerning shingles was given. Discussed the possibility of PHN. He will contact me if there is any questions concerning infection or worsening problem. Discussed being around pregnant women and small children.

## 2012-09-13 ENCOUNTER — Telehealth: Payer: Self-pay | Admitting: Family Medicine

## 2012-09-13 NOTE — Telephone Encounter (Signed)
TSD  

## 2013-09-25 IMAGING — CT CT ABD-PELV W/ CM
2 of 4 series · 14 of 32 positions shown, 19 images · IV contrast (water/omni  & 100ml omni 300)
Comparison: None.

CLINICAL DATA: Right lower quadrant abdominal pain.

CT ABDOMEN AND PELVIS WITH CONTRAST
TECHNIQUE: Multidetector CT imaging of the abdomen and pelvis was
performed following the standard protocol during bolus
administration of intravenous contrast.
Contrast: 100mL OMNIPAQUE IOHEXOL 300 MG/ML  SOLN

[Series 2: routine abdomen · axial · 0.87mm/px · z∈[-505,-155]mm · 8 of 90 slices shown, 13 images]
[im 10/90  soft-tissue]
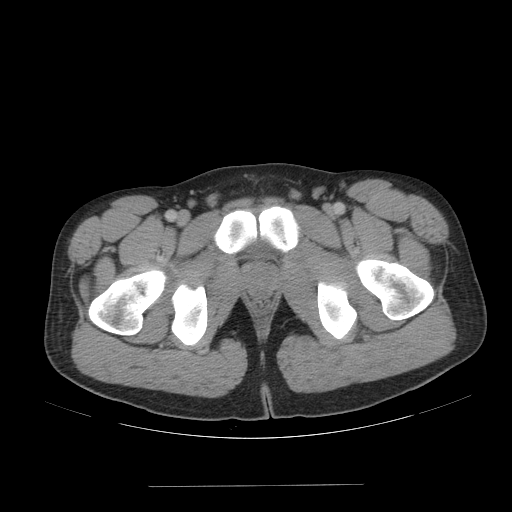
[im 10/90  bone]
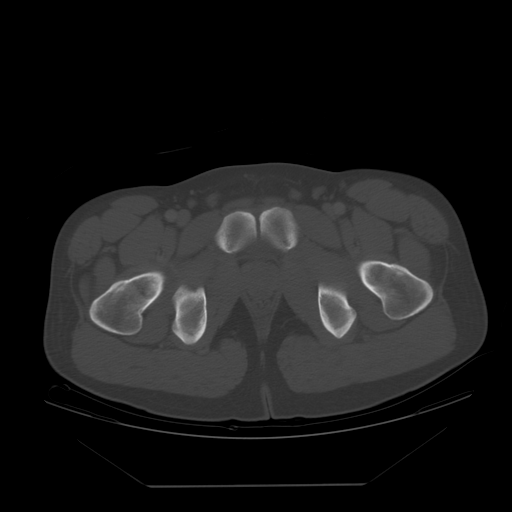
[im 20/90  soft-tissue]
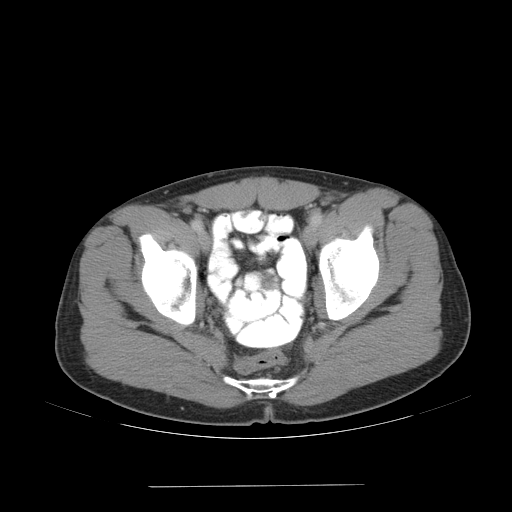
[im 30/90  soft-tissue]
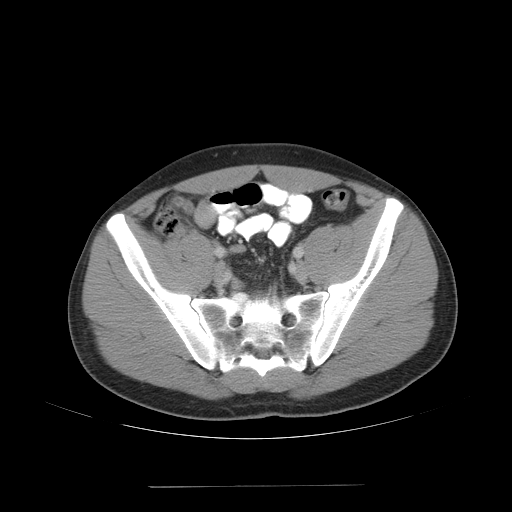
[im 40/90  soft-tissue]
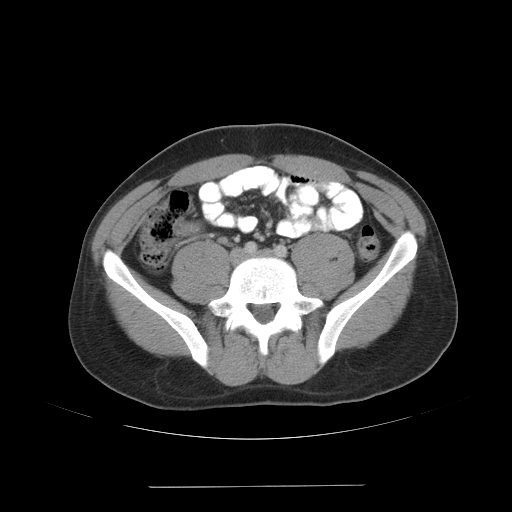
[im 50/90  soft-tissue]
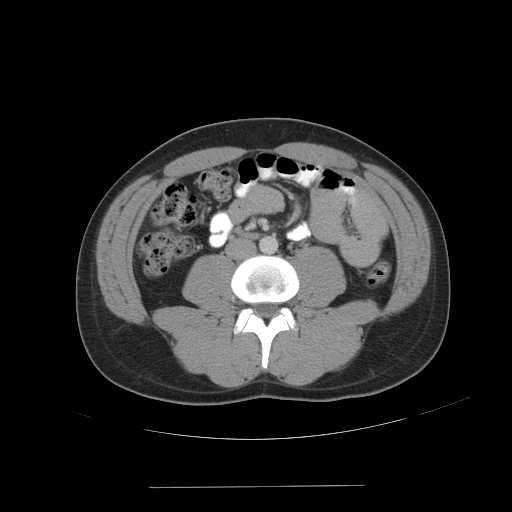
[im 50/90  lung]
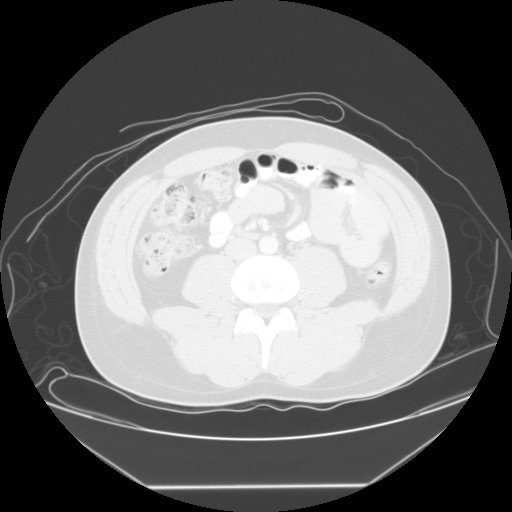
[im 60/90  soft-tissue]
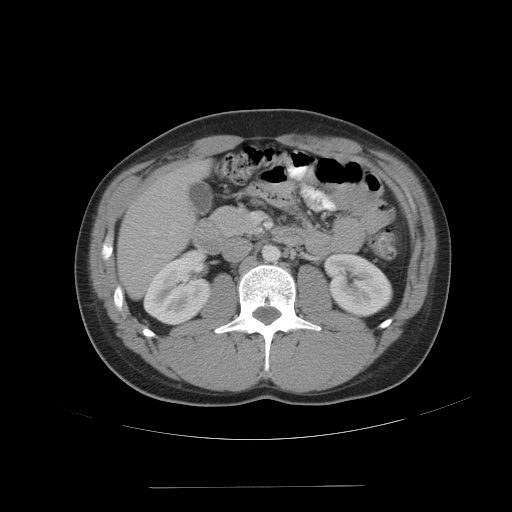
[im 60/90  lung]
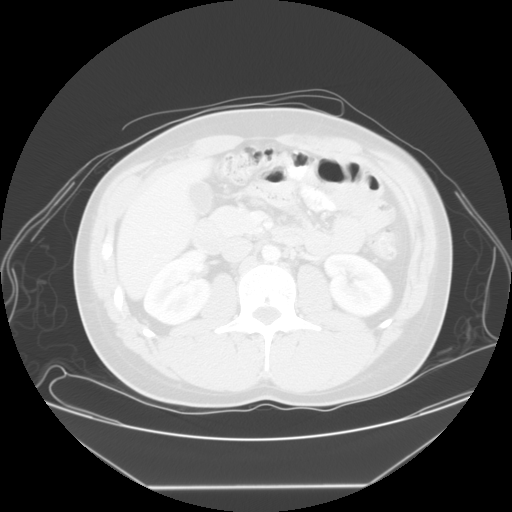
[im 70/90  soft-tissue]
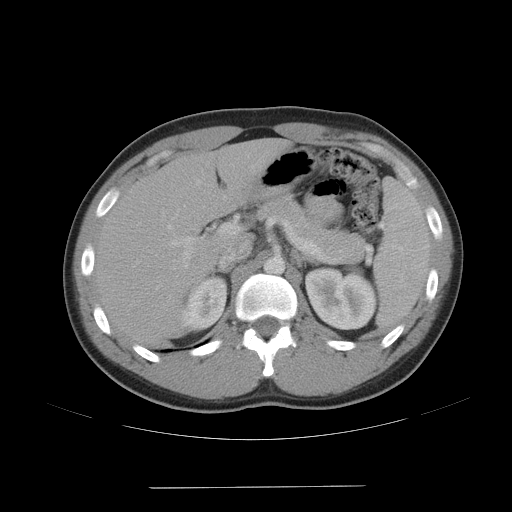
[im 70/90  lung]
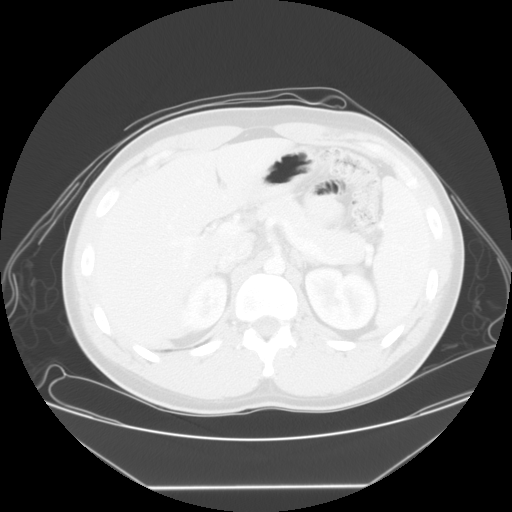
[im 80/90  soft-tissue]
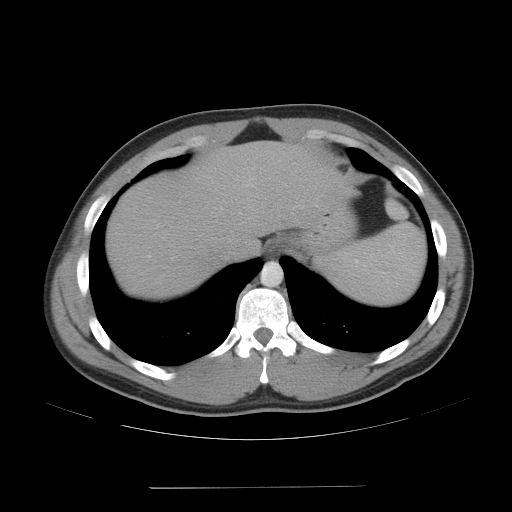
[im 80/90  lung]
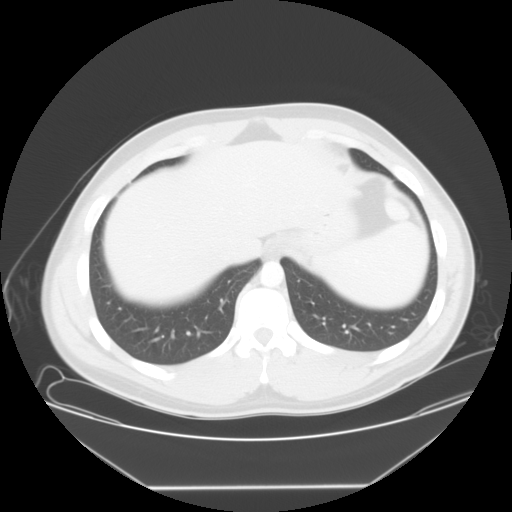

[Series 401: sagittal · sagittal · 0.93mm/px · 6 of 108 slices shown]
[im 10/108  soft-tissue]
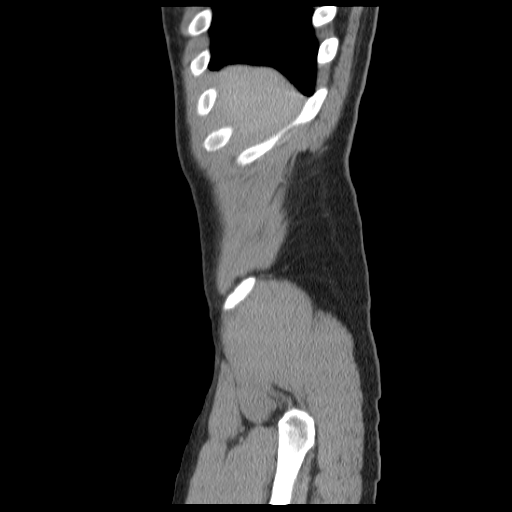
[im 20/108  soft-tissue]
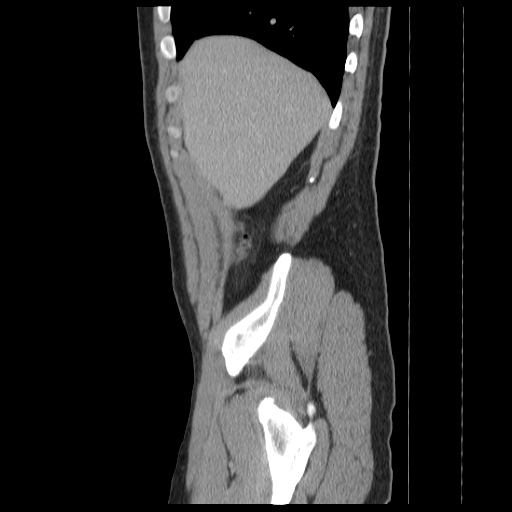
[im 39/108  soft-tissue]
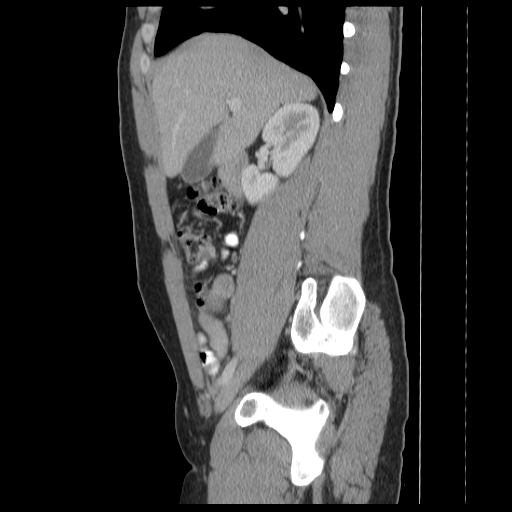
[im 49/108  soft-tissue]
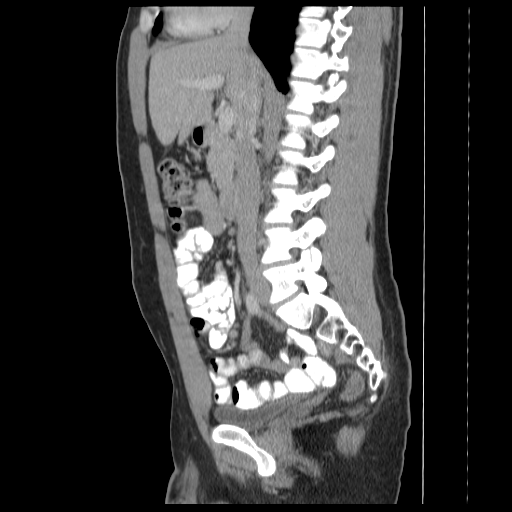
[im 59/108  soft-tissue]
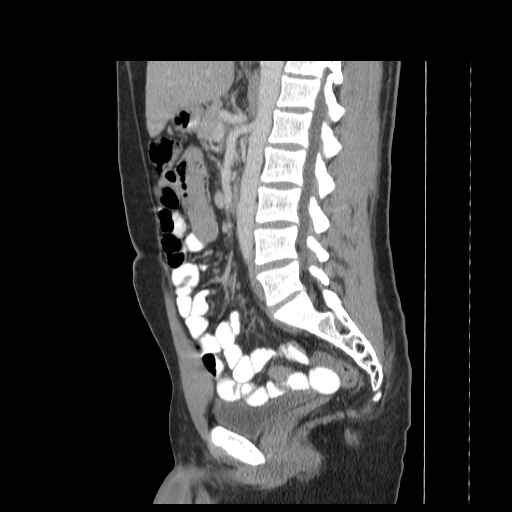
[im 69/108  soft-tissue]
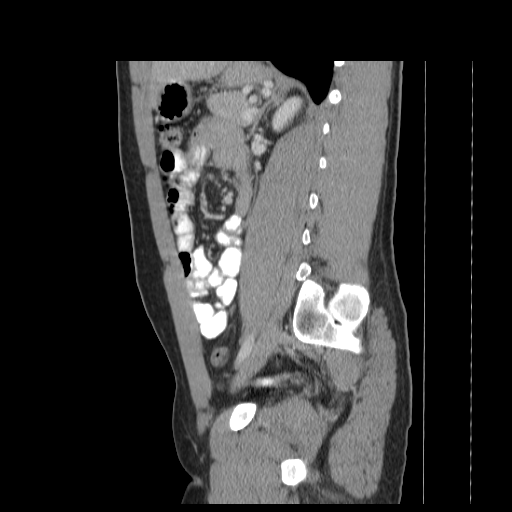

[14 of 32 positions shown; findings below may reference images not displayed]

FINDINGS: The visualized lung bases are clear.

The liver and spleen are unremarkable in appearance.  The
gallbladder is within normal limits.  The pancreas and adrenal
glands are unremarkable.

The kidneys are unremarkable in appearance.  There is no evidence
of hydronephrosis.  No renal or ureteral stones are seen.  No
perinephric stranding is appreciated.

No free fluid is identified.  The small bowel is unremarkable in
appearance.  The stomach is within normal limits.  No acute
vascular abnormalities are seen.

The appendix is dilated to 1.1 cm in maximal diameter, with
associated wall enhancement and thickening, and mild associated
soft tissue stranding.  This is compatible with mild acute
appendicitis.  The appendix is predominately inferior to the cecum.

No significant pericecal lymphadenopathy is seen.  There is no
evidence of perforation or abscess formation.  The colon is
unremarkable in appearance.

The bladder is mildly distended and grossly unremarkable in
appearance.  The prostate is normal in size.  No inguinal
lymphadenopathy is seen.

No acute osseous abnormalities are identified.
IMPRESSION: Mild acute appendicitis noted, with dilatation of the appendix to
1.1 cm in maximal diameter, and associated wall enhancement and
thickening.  Mild soft tissue stranding noted.  No evidence for
perforation or abscess formation.

## 2014-11-02 ENCOUNTER — Ambulatory Visit (INDEPENDENT_AMBULATORY_CARE_PROVIDER_SITE_OTHER): Payer: Commercial Managed Care - PPO | Admitting: Family Medicine

## 2014-11-02 ENCOUNTER — Encounter: Payer: Self-pay | Admitting: Family Medicine

## 2014-11-02 VITALS — BP 120/80

## 2014-11-02 DIAGNOSIS — L723 Sebaceous cyst: Secondary | ICD-10-CM

## 2014-11-02 NOTE — Progress Notes (Signed)
   Subjective:    Patient ID: Seth Lynch, male    DOB: 1990/12/02, 24 y.o.   MRN: [id number]  HPI He is here at the recommendation of his girlfriend to evaluate a lesion in the right upper outer breast area. It is not causing any difficulty.   Review of Systems     Objective:   Physical Exam He has an oblong smooth movable lesion in the right upper outer quadrant of the breast.       Assessment & Plan:  Sebaceous cyst  I reassured him that I did not think this was anything significant. If it gets larger or infected, he is to return here.

## 2015-02-04 ENCOUNTER — Ambulatory Visit (INDEPENDENT_AMBULATORY_CARE_PROVIDER_SITE_OTHER): Payer: Commercial Managed Care - PPO | Admitting: Family Medicine

## 2015-02-04 ENCOUNTER — Encounter: Payer: Self-pay | Admitting: Family Medicine

## 2015-02-04 VITALS — BP 120/82 | HR 63 | Wt 200.2 lb

## 2015-02-04 DIAGNOSIS — Z789 Other specified health status: Secondary | ICD-10-CM | POA: Diagnosis not present

## 2015-02-04 DIAGNOSIS — R208 Other disturbances of skin sensation: Secondary | ICD-10-CM | POA: Diagnosis not present

## 2015-02-04 LAB — CBC WITH DIFFERENTIAL/PLATELET
BASOS ABS: 0.1 10*3/uL (ref 0.0–0.1)
BASOS PCT: 1 % (ref 0–1)
EOS PCT: 7 % — AB (ref 0–5)
Eosinophils Absolute: 0.4 10*3/uL (ref 0.0–0.7)
HCT: 44.7 % (ref 39.0–52.0)
HEMOGLOBIN: 16.1 g/dL (ref 13.0–17.0)
LYMPHS PCT: 34 % (ref 12–46)
Lymphs Abs: 1.9 10*3/uL (ref 0.7–4.0)
MCH: 30.1 pg (ref 26.0–34.0)
MCHC: 36 g/dL (ref 30.0–36.0)
MCV: 83.6 fL (ref 78.0–100.0)
MONO ABS: 0.4 10*3/uL (ref 0.1–1.0)
MPV: 9.7 fL (ref 8.6–12.4)
Monocytes Relative: 8 % (ref 3–12)
NEUTROS ABS: 2.8 10*3/uL (ref 1.7–7.7)
NEUTROS PCT: 50 % (ref 43–77)
PLATELETS: 222 10*3/uL (ref 150–400)
RBC: 5.35 MIL/uL (ref 4.22–5.81)
RDW: 13.5 % (ref 11.5–15.5)
WBC: 5.6 10*3/uL (ref 4.0–10.5)

## 2015-02-04 LAB — LIPID PANEL
CHOLESTEROL: 184 mg/dL (ref 0–200)
HDL: 43 mg/dL (ref >=40)
LDL CALC: 120 mg/dL — AB (ref 0–99)
TRIGLYCERIDES: 104 mg/dL (ref <150)
Total CHOL/HDL Ratio: 4.3 Ratio
VLDL: 21 mg/dL (ref 0–40)

## 2015-02-04 LAB — COMPREHENSIVE METABOLIC PANEL
ALT: 39 U/L (ref 0–53)
AST: 24 U/L (ref 0–37)
Albumin: 4.8 g/dL (ref 3.5–5.2)
Alkaline Phosphatase: 29 U/L — ABNORMAL LOW (ref 39–117)
BUN: 10 mg/dL (ref 6–23)
CALCIUM: 9.9 mg/dL (ref 8.4–10.5)
CHLORIDE: 104 meq/L (ref 96–112)
CO2: 23 meq/L (ref 19–32)
CREATININE: 0.7 mg/dL (ref 0.50–1.35)
GLUCOSE: 81 mg/dL (ref 70–99)
POTASSIUM: 4.5 meq/L (ref 3.5–5.3)
SODIUM: 140 meq/L (ref 135–145)
Total Bilirubin: 0.7 mg/dL (ref 0.2–1.2)
Total Protein: 7.1 g/dL (ref 6.0–8.3)

## 2015-02-04 NOTE — Progress Notes (Signed)
   Subjective:    Patient ID: Seth Lynch, male    DOB: 10/04/1990, 24 y.o.   MRN: [id number]  HPI He is here for consult concerning symptoms similar to when he had shingles in 2013. He has had various places on his body that he would feel intense pain that last for a day or 2 and then go away. The areas were change and he would never develop a rash. He has had no fever, chills, pulmonary or cardiac problems. He then when on to explain that he has had difficulty with his focus and feels sometimes overwhelmed. He starts doubting his abilities to get things done. He is quite busy with his work working in [employer].He also is a vegetarian and is concerned over any potential problems from that.  Review of Systems     Objective:   Physical Exam Alert and in no distress. Tympanic membranes and canals are normal. Pharyngeal area is normal. Neck is supple without adenopathy or thyromegaly. Cardiac exam shows a regular sinus rhythm without murmurs or gallops. Lungs are clear to auscultation. Skin is normal. DTRs normal.       Assessment & Plan:  Dysesthesia of multiple sites - Plan: CBC with Differential/Platelet, Comprehensive metabolic panel, Lipid panel  Vegetarian diet - Plan: CBC with Differential/Platelet, Comprehensive metabolic panel, Lipid panel I explained that I did not think he was having a current bouts of shingles. He would be much sicker if that were the case indicating an underlying immunodeficiency. Further discussion with him indicates he probably needs help with organizing his life. Recommended that he make a schedule and try to set stick to it. Also discussed possible referral for counseling and may do that at a later date.

## 2015-02-04 NOTE — Patient Instructions (Signed)
Make a daily schedule. The schedule should be what has to be done today, what would like to be done today and what doesn't matter when he gets done
